# Patient Record
Sex: Male | Born: 1995 | Race: White | Hispanic: No | Marital: Single | Smoking: Current every day smoker
Health system: Southern US, Community
[De-identification: ages and names within clinical notes are randomized; demographics above are authoritative.]

## PROBLEM LIST (undated history)

## (undated) DIAGNOSIS — F419 Anxiety disorder, unspecified: Secondary | ICD-10-CM

## (undated) DIAGNOSIS — M5090 Cervical disc disorder, unspecified, unspecified cervical region: Secondary | ICD-10-CM

## (undated) HISTORY — DX: Cervical disc disorder, unspecified, unspecified cervical region: M50.90

## (undated) HISTORY — DX: Anxiety disorder, unspecified: F41.9

---
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
## 2016-07-04 ENCOUNTER — Inpatient Hospital Stay
Admit: 2016-07-04 | Discharge: 2016-07-04 | Disposition: A | Discharge status: Home / Self Care | Payer: PRIVATE HEALTH INSURANCE | Attending: Emergency Medicine

## 2016-07-04 DIAGNOSIS — T1592XA Foreign body on external eye, part unspecified, left eye, initial encounter: Secondary | ICD-10-CM

## 2016-07-04 MED ORDER — FLUORESCEIN SODIUM 1 MG OP STRP
1 MG | Freq: Once | OPHTHALMIC | Status: DC
Start: 2016-07-04 — End: 2016-07-04

## 2016-07-04 MED ORDER — TETRACAINE HCL 0.5 % OP SOLN
0.5 % | Freq: Once | OPHTHALMIC | Status: AC
Start: 2016-07-04 — End: 2016-07-04
  Administered 2016-07-04: 19:00:00 2 [drp] via OPHTHALMIC

## 2016-07-04 MED ORDER — TETRACAINE HCL 0.5 % OP SOLN
0.5 % | Freq: Once | OPHTHALMIC | Status: DC
Start: 2016-07-04 — End: 2016-07-04

## 2016-07-04 MED ORDER — FLUORESCEIN SODIUM 0.6 MG OP STRP
0.6 MG | Freq: Once | OPHTHALMIC | Status: AC
Start: 2016-07-04 — End: 2016-07-04
  Administered 2016-07-04: 19:00:00 1 via OPHTHALMIC

## 2016-07-04 MED FILL — TETRACAINE HCL 0.5 % OP SOLN: 0.5 % | OPHTHALMIC | Qty: 15

## 2016-07-04 MED FILL — FUL-GLO 1 MG OP STRP: 1 MG | OPHTHALMIC | Qty: 1

## 2016-07-04 NOTE — ED Notes (Addendum)
Bilateral 20/40  R 20/40  L 20/40  Usually wears glasses does not have them     Kelby Aline, RN  07/04/16 1357

## 2016-07-04 NOTE — ED Provider Notes (Signed)
This is a 21 y.o. male patient with No primary care provider on file., currently admitted under the care of Joylene Draft, DO, who presented to the [facility] ED with a chief complaint of eye pain.     Patient states that last night a friend and himself were playing a video game. When he felt something "fly" into his eye. Ever since then he has been struggling with eye pain and severe tearing of his eye. He continues to have a some light sensitivities. He denies any changes in vision. He does not wear contacts.           Foreign Body   Location:  L eye  Suspected object:  Unable to specify  Pain quality:  Aching, dull, pressure and tingling  Pain severity:  Moderate  Duration:  12 hours  Timing:  Constant  Progression:  Unchanged  Chronicity:  Recurrent  Worsened by:  Nothing  Ineffective treatments:  None tried  Associated symptoms: no abdominal pain, no cough, no cyanosis, no drooling, no ear pain, no nausea, no rhinorrhea, no sore throat and no vomiting    Risk factors: no developmental delay, no prior similar events and no prior surgery to area        Review of Systems   Constitutional: Negative for chills and fever.   HENT: Negative for drooling, ear pain, rhinorrhea, sinus pressure and sore throat.    Eyes: Positive for photophobia, pain and redness. Negative for discharge and visual disturbance.   Respiratory: Negative for cough, chest tightness, shortness of breath and wheezing.    Cardiovascular: Negative for chest pain and cyanosis.   Gastrointestinal: Negative for abdominal pain, diarrhea, nausea and vomiting.   Genitourinary: Negative for dysuria and frequency.   Musculoskeletal: Negative for arthralgias and back pain.   Skin: Negative for rash and wound.   Neurological: Negative for weakness and headaches.   Hematological: Negative for adenopathy.   All other systems reviewed and are negative.      Physical Exam   Constitutional: He is oriented to person, place, and time. He appears well-developed and  well-nourished.   HENT:   Head: Normocephalic and atraumatic.   Eyes: Foreign body present in the left eye. Left conjunctiva is injected. Left conjunctiva has a hemorrhage.   Slit lamp exam:       The left eye shows no corneal abrasion, no corneal flare, no corneal ulcer and no fluorescein uptake.   Neck: Normal range of motion. Neck supple.   Cardiovascular: Normal rate, regular rhythm and normal heart sounds.    No murmur heard.  Pulmonary/Chest: Effort normal and breath sounds normal. No respiratory distress. He has no wheezes. He has no rales.   Abdominal: Soft. Bowel sounds are normal. There is no tenderness. There is no rebound and no guarding.   Musculoskeletal: He exhibits no edema, tenderness or deformity.   Neurological: He is alert and oriented to person, place, and time. No cranial nerve deficit. Coordination normal.   Skin: Skin is warm and dry.   Nursing note and vitals reviewed.      Procedures    MDM  Number of Diagnoses or Management Options  Foreign body of left external eye, initial encounter:      Amount and/or Complexity of Data Reviewed  Discuss the patient with other providers: yes    Risk of Complications, Morbidity, and/or Mortality  Presenting problems: low  Diagnostic procedures: minimal  Management options: minimal    Patient Progress  Patient progress: improved  ED Course        Labs      Radiology      EKG Interpretation.    --------------------------------------------- PAST HISTORY ---------------------------------------------  Past Medical History:  has no past medical history on file.    Past Surgical History:  has no past surgical history on file.    Social History:  reports that he has never smoked. He has never used smokeless tobacco.    Family History: family history is not on file.     The patient's home medications have been reviewed.    Allergies: Amoxicillin    -------------------------------------------------- RESULTS  -------------------------------------------------  Labs:  No results found for this visit on 07/04/16.    Radiology:  No orders to display       ------------------------- NURSING NOTES AND VITALS REVIEWED ---------------------------  Date / Time Roomed:  07/04/2016  1:51 PM  ED Bed Assignment:  33/33    The nursing notes within the ED encounter and vital signs as below have been reviewed.   BP 117/72   Pulse 67   Resp 16   Ht 6' (1.829 m)   Wt 165 lb (74.8 kg)   SpO2 99%   BMI 22.38 kg/m   Oxygen Saturation Interpretation: Normal      ------------------------------------------ PROGRESS NOTES ------------------------------------------  3:00 PM  I have spoken with the patient and discussed today's results, in addition to providing specific details for the plan of care and counseling regarding the diagnosis and prognosis.  Their questions are answered at this time and they are agreeable with the plan. I discussed at length with them reasons for immediate return here for re evaluation. They will followup with their Opthalmologist and primary care physician by calling their office tomorrow.      --------------------------------- ADDITIONAL PROVIDER NOTES ---------------------------------  At this time the patient is without objective evidence of an acute process requiring hospitalization or inpatient management.  They have remained hemodynamically stable throughout their entire ED visit and are stable for discharge with outpatient follow-up.     The plan has been discussed in detail and they are aware of the specific conditions for emergent return, as well as the importance of follow-up.      There are no discharge medications for this patient.    Patient Name: Frederick Jarvis   Medical Record Number: [id number]  Date: 07/04/2016   Time: 3:01 PM   Room/Bed: 33/33  Eye Foreign Body Procedure Note  Indication: foreign body present in the eye    Procedure: The patient's head was positioned appropriately to provide adequate  exposure of the left eye using direct visualization and [employer].  Anesthesia was obtained using tetracaine drops.  Fluorescein staining was performed in the left eye and revealed no eye abrasions or increased uptake.  A less than 1 mm foreign body with the appearance of plastic was removed using a cotton swab.    The patient tolerated the procedure well.    Complications: None    Electronically Signed by:   Ralph Leyden  3:02 PM  07/04/16      Diagnosis:  1. Foreign body of left external eye, initial encounter        Disposition:  Patient's disposition: Discharge to home  Patient's condition is stable.           Ralph Leyden, MD  Resident  07/04/16 [phone]

## 2016-07-04 NOTE — ED Notes (Signed)
Frederick Jarvis is a 21 y.o. male who presented to the ED on 07/04/16 complaining of   Chief Complaint   Patient presents with   . Eye Injury     left eye, felt something get into it last night             Kelby Aline, RN  07/04/16 1358

## 2018-02-07 DIAGNOSIS — H5213 Myopia, bilateral: Secondary | ICD-10-CM | POA: Diagnosis not present

## 2018-02-07 DIAGNOSIS — H52223 Regular astigmatism, bilateral: Secondary | ICD-10-CM | POA: Diagnosis not present

## 2018-02-07 DIAGNOSIS — H04123 Dry eye syndrome of bilateral lacrimal glands: Secondary | ICD-10-CM | POA: Diagnosis not present

## 2018-02-07 DIAGNOSIS — H1045 Other chronic allergic conjunctivitis: Secondary | ICD-10-CM | POA: Diagnosis not present

## 2018-04-28 DIAGNOSIS — R1084 Generalized abdominal pain: Secondary | ICD-10-CM | POA: Diagnosis not present

## 2018-04-28 DIAGNOSIS — E559 Vitamin D deficiency, unspecified: Secondary | ICD-10-CM | POA: Diagnosis not present

## 2018-04-28 DIAGNOSIS — R5383 Other fatigue: Secondary | ICD-10-CM | POA: Diagnosis not present

## 2018-04-28 DIAGNOSIS — Z0000 Encounter for general adult medical examination without abnormal findings: Secondary | ICD-10-CM | POA: Diagnosis not present

## 2018-04-28 DIAGNOSIS — M545 Low back pain: Secondary | ICD-10-CM | POA: Diagnosis not present

## 2018-04-28 DIAGNOSIS — Z13220 Encounter for screening for lipoid disorders: Secondary | ICD-10-CM | POA: Diagnosis not present

## 2018-04-29 ENCOUNTER — Other Ambulatory Visit (HOSPITAL_COMMUNITY): Payer: Self-pay | Admitting: Student

## 2018-04-29 ENCOUNTER — Ambulatory Visit (HOSPITAL_COMMUNITY)
Admission: RE | Admit: 2018-04-29 | Discharge: 2018-04-29 | Disposition: A | Discharge status: Home / Self Care | Payer: Self-pay | Source: Ambulatory Visit | Attending: Internal Medicine | Admitting: Internal Medicine

## 2018-04-29 ENCOUNTER — Other Ambulatory Visit (HOSPITAL_COMMUNITY): Payer: Self-pay | Admitting: Internal Medicine

## 2018-04-29 DIAGNOSIS — M545 Low back pain, unspecified: Secondary | ICD-10-CM

## 2018-05-09 ENCOUNTER — Other Ambulatory Visit (HOSPITAL_COMMUNITY): Payer: Self-pay | Admitting: Internal Medicine

## 2018-05-09 ENCOUNTER — Other Ambulatory Visit: Payer: Self-pay | Admitting: Internal Medicine

## 2018-05-09 DIAGNOSIS — M545 Low back pain, unspecified: Secondary | ICD-10-CM

## 2018-05-12 ENCOUNTER — Ambulatory Visit (HOSPITAL_COMMUNITY): Payer: Self-pay

## 2018-05-14 ENCOUNTER — Ambulatory Visit (HOSPITAL_COMMUNITY)
Admission: RE | Admit: 2018-05-14 | Discharge: 2018-05-14 | Disposition: A | Discharge status: Home / Self Care | Payer: 59 | Source: Ambulatory Visit | Attending: Internal Medicine | Admitting: Internal Medicine

## 2018-05-14 DIAGNOSIS — M545 Low back pain, unspecified: Secondary | ICD-10-CM

## 2018-05-23 DIAGNOSIS — M50122 Cervical disc disorder at C5-C6 level with radiculopathy: Secondary | ICD-10-CM | POA: Diagnosis not present

## 2018-05-23 DIAGNOSIS — M9903 Segmental and somatic dysfunction of lumbar region: Secondary | ICD-10-CM | POA: Diagnosis not present

## 2018-05-23 DIAGNOSIS — M546 Pain in thoracic spine: Secondary | ICD-10-CM | POA: Diagnosis not present

## 2018-05-23 DIAGNOSIS — M9901 Segmental and somatic dysfunction of cervical region: Secondary | ICD-10-CM | POA: Diagnosis not present

## 2018-05-23 DIAGNOSIS — M9902 Segmental and somatic dysfunction of thoracic region: Secondary | ICD-10-CM | POA: Diagnosis not present

## 2018-05-23 DIAGNOSIS — M542 Cervicalgia: Secondary | ICD-10-CM | POA: Diagnosis not present

## 2018-05-23 DIAGNOSIS — M545 Low back pain: Secondary | ICD-10-CM | POA: Diagnosis not present

## 2018-05-25 DIAGNOSIS — M546 Pain in thoracic spine: Secondary | ICD-10-CM | POA: Diagnosis not present

## 2018-05-25 DIAGNOSIS — M545 Low back pain: Secondary | ICD-10-CM | POA: Diagnosis not present

## 2018-05-25 DIAGNOSIS — M9902 Segmental and somatic dysfunction of thoracic region: Secondary | ICD-10-CM | POA: Diagnosis not present

## 2018-05-25 DIAGNOSIS — M50122 Cervical disc disorder at C5-C6 level with radiculopathy: Secondary | ICD-10-CM | POA: Diagnosis not present

## 2018-05-25 DIAGNOSIS — M9903 Segmental and somatic dysfunction of lumbar region: Secondary | ICD-10-CM | POA: Diagnosis not present

## 2018-05-25 DIAGNOSIS — M542 Cervicalgia: Secondary | ICD-10-CM | POA: Diagnosis not present

## 2018-05-25 DIAGNOSIS — M9901 Segmental and somatic dysfunction of cervical region: Secondary | ICD-10-CM | POA: Diagnosis not present

## 2018-05-26 DIAGNOSIS — M9901 Segmental and somatic dysfunction of cervical region: Secondary | ICD-10-CM | POA: Diagnosis not present

## 2018-05-26 DIAGNOSIS — M542 Cervicalgia: Secondary | ICD-10-CM | POA: Diagnosis not present

## 2018-05-26 DIAGNOSIS — M9903 Segmental and somatic dysfunction of lumbar region: Secondary | ICD-10-CM | POA: Diagnosis not present

## 2018-05-26 DIAGNOSIS — M50122 Cervical disc disorder at C5-C6 level with radiculopathy: Secondary | ICD-10-CM | POA: Diagnosis not present

## 2018-05-26 DIAGNOSIS — M9902 Segmental and somatic dysfunction of thoracic region: Secondary | ICD-10-CM | POA: Diagnosis not present

## 2018-05-26 DIAGNOSIS — M545 Low back pain: Secondary | ICD-10-CM | POA: Diagnosis not present

## 2018-05-26 DIAGNOSIS — M546 Pain in thoracic spine: Secondary | ICD-10-CM | POA: Diagnosis not present

## 2018-06-01 DIAGNOSIS — M9902 Segmental and somatic dysfunction of thoracic region: Secondary | ICD-10-CM | POA: Diagnosis not present

## 2018-06-01 DIAGNOSIS — M545 Low back pain: Secondary | ICD-10-CM | POA: Diagnosis not present

## 2018-06-01 DIAGNOSIS — M546 Pain in thoracic spine: Secondary | ICD-10-CM | POA: Diagnosis not present

## 2018-06-01 DIAGNOSIS — M542 Cervicalgia: Secondary | ICD-10-CM | POA: Diagnosis not present

## 2018-06-01 DIAGNOSIS — M50122 Cervical disc disorder at C5-C6 level with radiculopathy: Secondary | ICD-10-CM | POA: Diagnosis not present

## 2018-06-01 DIAGNOSIS — M9901 Segmental and somatic dysfunction of cervical region: Secondary | ICD-10-CM | POA: Diagnosis not present

## 2018-06-01 DIAGNOSIS — M9903 Segmental and somatic dysfunction of lumbar region: Secondary | ICD-10-CM | POA: Diagnosis not present

## 2018-06-02 DIAGNOSIS — M9901 Segmental and somatic dysfunction of cervical region: Secondary | ICD-10-CM | POA: Diagnosis not present

## 2018-06-02 DIAGNOSIS — M9903 Segmental and somatic dysfunction of lumbar region: Secondary | ICD-10-CM | POA: Diagnosis not present

## 2018-06-02 DIAGNOSIS — M545 Low back pain: Secondary | ICD-10-CM | POA: Diagnosis not present

## 2018-06-02 DIAGNOSIS — M542 Cervicalgia: Secondary | ICD-10-CM | POA: Diagnosis not present

## 2018-06-02 DIAGNOSIS — M50122 Cervical disc disorder at C5-C6 level with radiculopathy: Secondary | ICD-10-CM | POA: Diagnosis not present

## 2018-06-02 DIAGNOSIS — M9902 Segmental and somatic dysfunction of thoracic region: Secondary | ICD-10-CM | POA: Diagnosis not present

## 2018-06-02 DIAGNOSIS — M546 Pain in thoracic spine: Secondary | ICD-10-CM | POA: Diagnosis not present

## 2018-07-05 DIAGNOSIS — M9902 Segmental and somatic dysfunction of thoracic region: Secondary | ICD-10-CM | POA: Diagnosis not present

## 2018-07-05 DIAGNOSIS — M545 Low back pain: Secondary | ICD-10-CM | POA: Diagnosis not present

## 2018-07-05 DIAGNOSIS — M50122 Cervical disc disorder at C5-C6 level with radiculopathy: Secondary | ICD-10-CM | POA: Diagnosis not present

## 2018-07-05 DIAGNOSIS — M542 Cervicalgia: Secondary | ICD-10-CM | POA: Diagnosis not present

## 2018-07-05 DIAGNOSIS — M9901 Segmental and somatic dysfunction of cervical region: Secondary | ICD-10-CM | POA: Diagnosis not present

## 2018-07-05 DIAGNOSIS — M9903 Segmental and somatic dysfunction of lumbar region: Secondary | ICD-10-CM | POA: Diagnosis not present

## 2018-07-05 DIAGNOSIS — M546 Pain in thoracic spine: Secondary | ICD-10-CM | POA: Diagnosis not present

## 2018-07-07 DIAGNOSIS — M9903 Segmental and somatic dysfunction of lumbar region: Secondary | ICD-10-CM | POA: Diagnosis not present

## 2018-07-07 DIAGNOSIS — M546 Pain in thoracic spine: Secondary | ICD-10-CM | POA: Diagnosis not present

## 2018-07-07 DIAGNOSIS — M9901 Segmental and somatic dysfunction of cervical region: Secondary | ICD-10-CM | POA: Diagnosis not present

## 2018-07-07 DIAGNOSIS — M542 Cervicalgia: Secondary | ICD-10-CM | POA: Diagnosis not present

## 2018-07-07 DIAGNOSIS — M9902 Segmental and somatic dysfunction of thoracic region: Secondary | ICD-10-CM | POA: Diagnosis not present

## 2018-07-07 DIAGNOSIS — M545 Low back pain: Secondary | ICD-10-CM | POA: Diagnosis not present

## 2018-07-07 DIAGNOSIS — M50122 Cervical disc disorder at C5-C6 level with radiculopathy: Secondary | ICD-10-CM | POA: Diagnosis not present

## 2020-05-01 ENCOUNTER — Encounter: Payer: Self-pay | Admitting: Physician Assistant

## 2020-05-03 ENCOUNTER — Encounter: Payer: Self-pay | Admitting: Physician Assistant

## 2020-05-12 IMAGING — MR MR LUMBAR SPINE W/O CM
4 of 5 series · 19 of 48 positions shown · non-contrast
Comparison: Prior radiographs from 04/29/2018.

CLINICAL DATA: Initial evaluation for chronic low back pain for 3
years.

EXAM:
MRI LUMBAR SPINE WITHOUT CONTRAST
TECHNIQUE: Multiplanar, multisequence MR imaging of the lumbar spine was
performed. No intravenous contrast was administered.

[Series 3: T1 · sagittal · 4.0mm · 0.51mm/px · 3 of 12 slices shown (1 of 2)]
[im 3/12]
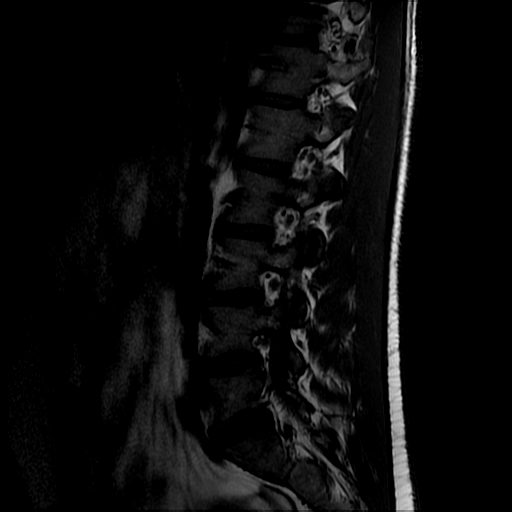
[im 7/12]
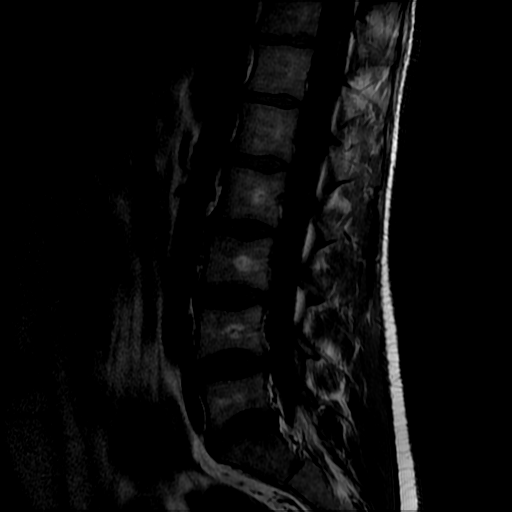
[im 12/12]
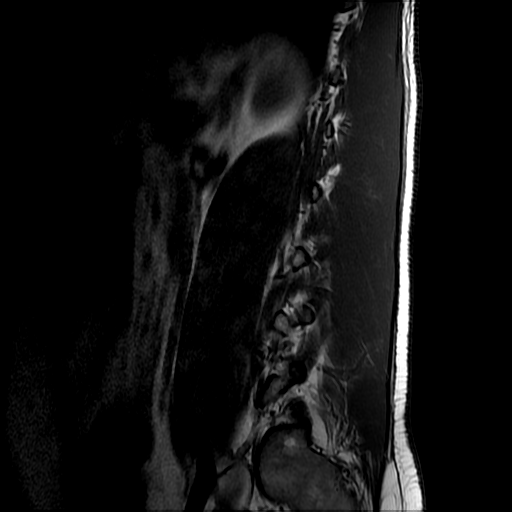

[Series 4: T2 post-contrast · sagittal · 4.0mm · 0.51mm/px · 5 of 12 slices shown]
[im 1/12]
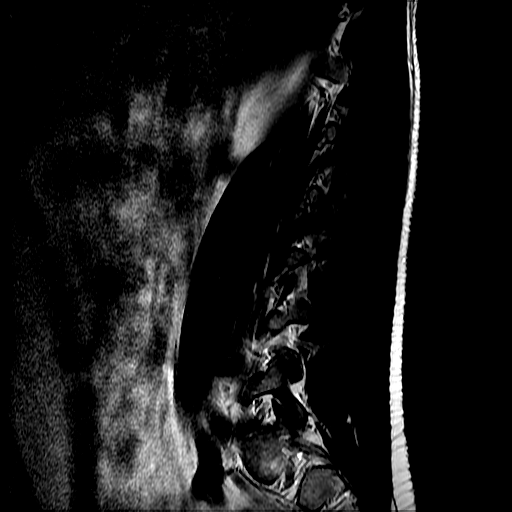
[im 3/12]
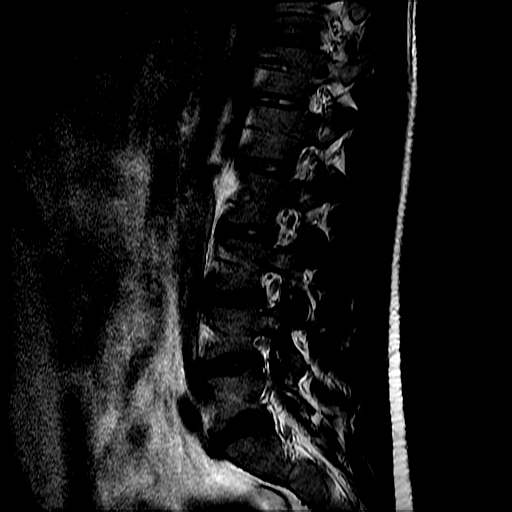
[im 6/12]
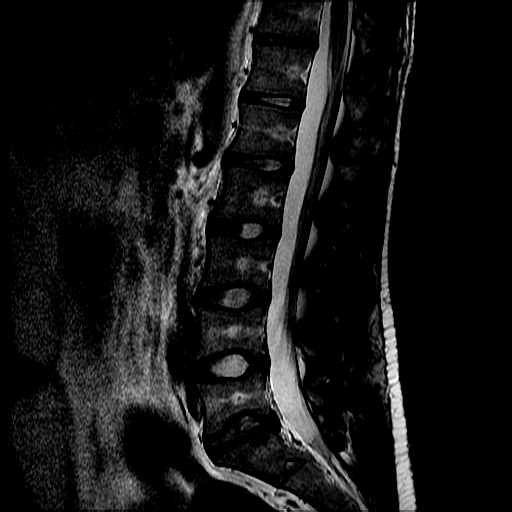
[im 9/12]
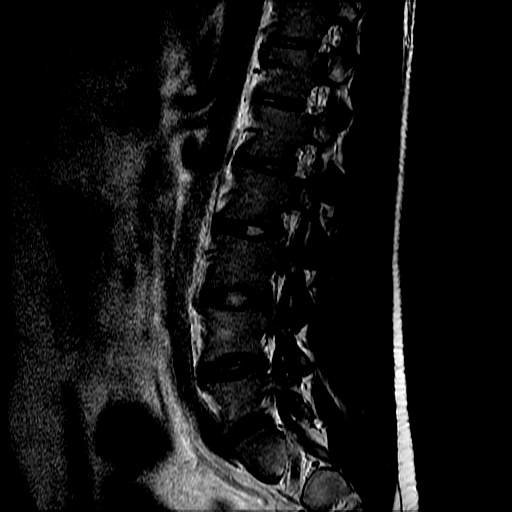
[im 12/12]
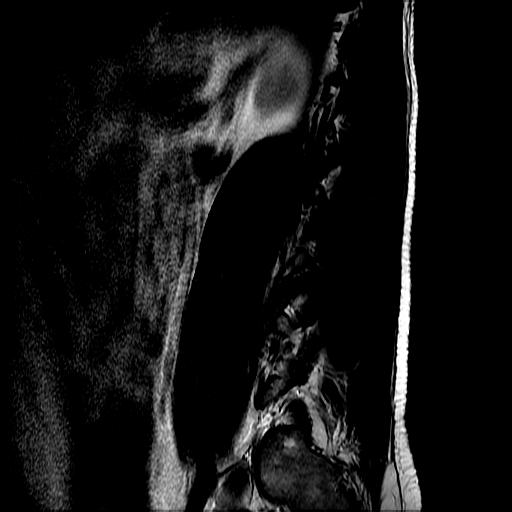

[Series 6: T2 · axial · 4.0mm · 0.39mm/px · z∈[-146,+27]mm · 8 of 36 slices shown]
[im 3/36]
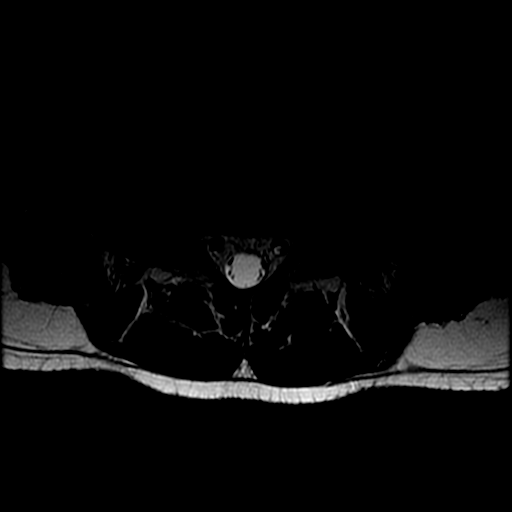
[im 5/36]
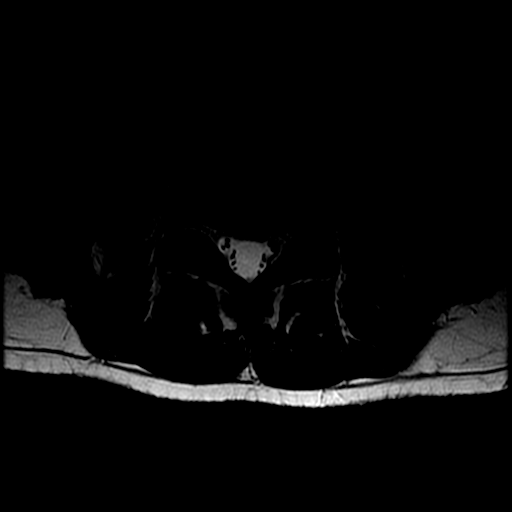
[im 8/36]
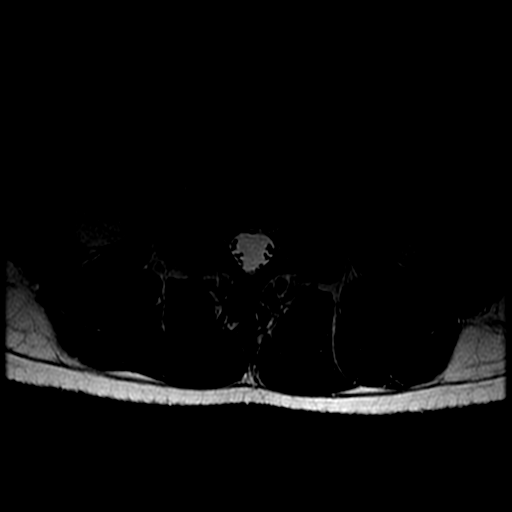
[im 12/36]
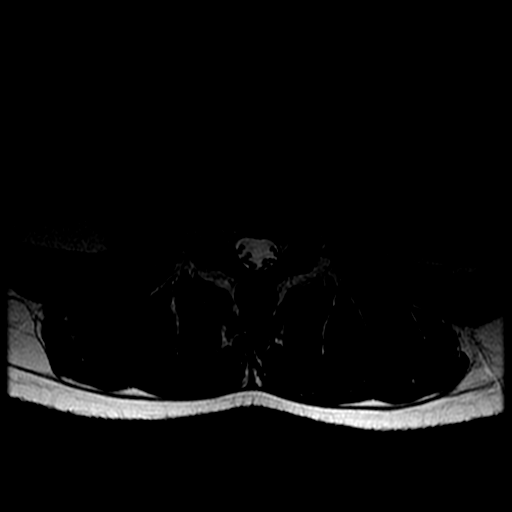
[im 17/36]
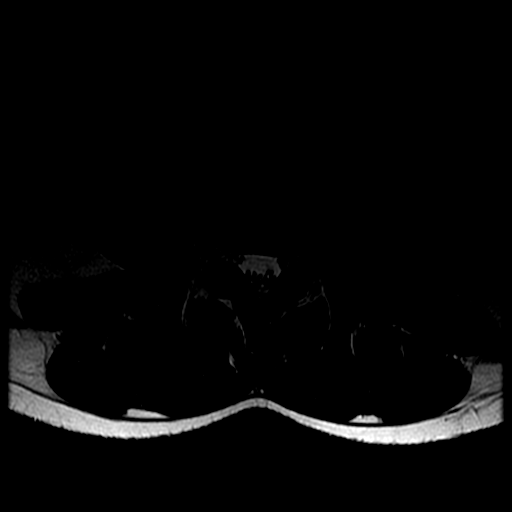
[im 19/36]
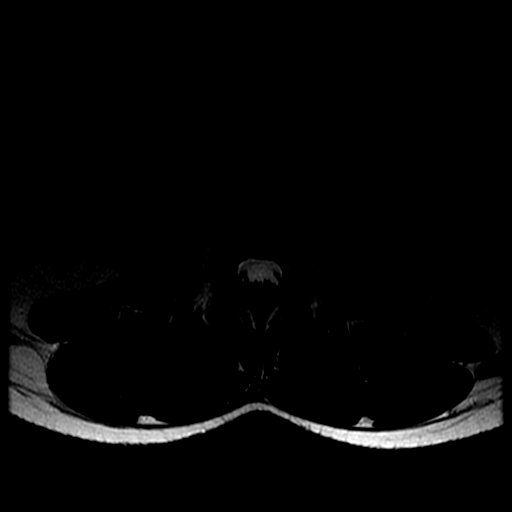
[im 22/36]
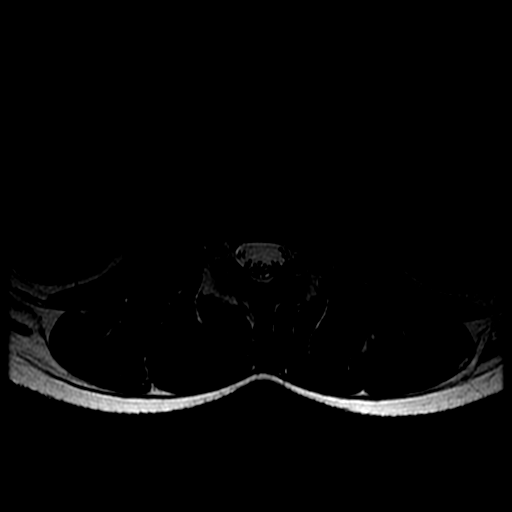
[im 31/36]
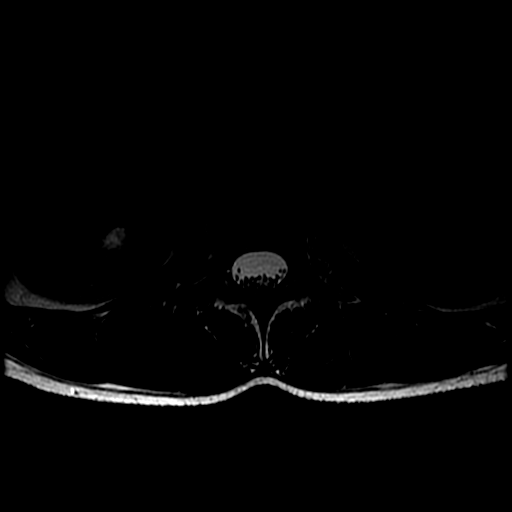

[Series 7: T1 · axial · 4.0mm · 0.39mm/px · z∈[-136,+27]mm · 3 of 36 slices shown (2 of 2)]
[im 5/36]
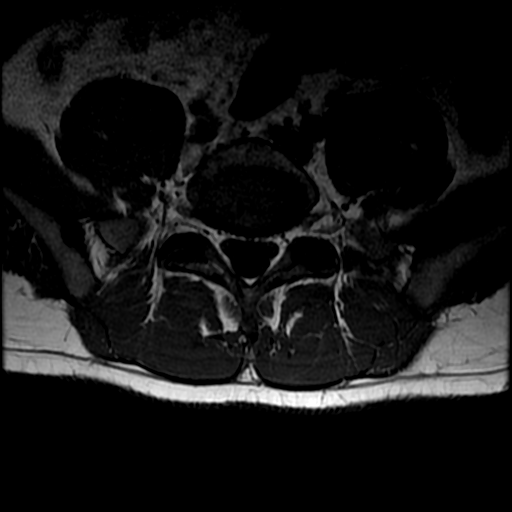
[im 19/36]
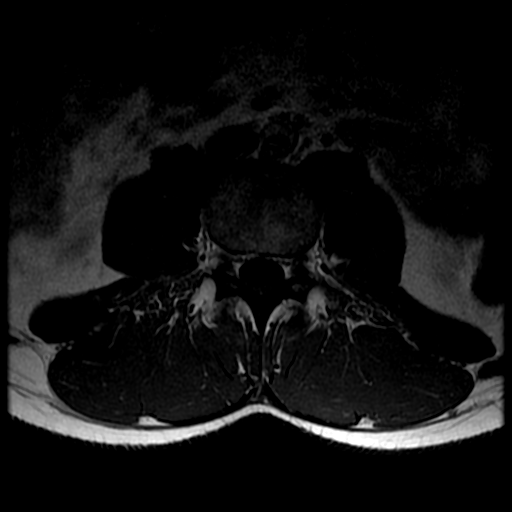
[im 31/36]
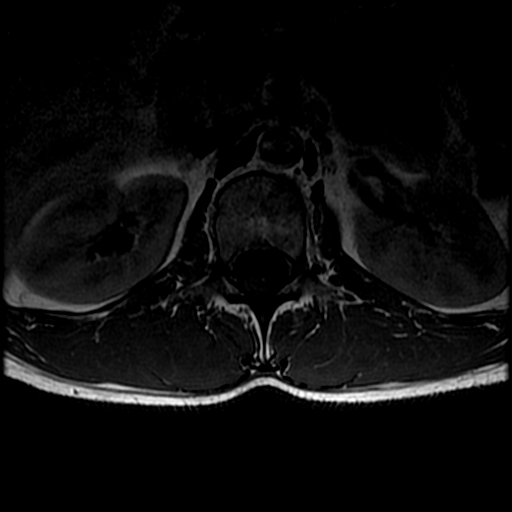

[19 of 48 positions shown; findings below may reference images not displayed]

FINDINGS: Segmentation: Standard. Lowest well-formed disc labeled the L5-S1
level.

Alignment: Mild levoscoliosis. Alignment otherwise normal with
preservation of the normal lumbar lordosis. No listhesis or
subluxation.

Vertebrae: Vertebral body heights maintained without evidence for
acute or chronic fracture. Bone marrow signal intensity within
normal limits. No discrete or worrisome osseous lesions. No abnormal
marrow edema to suggest acute stress reaction and/or stress
response. No stress fracture or pars defect.

Conus medullaris and cauda equina: Conus extends to the T12 level.
Conus and cauda equina appear normal.

Paraspinal and other soft tissues: Paraspinous soft tissues within
normal limits. Visualized visceral structures are unremarkable.

Disc levels:

No significant disc pathology within the lumbar spine.
Intervertebral discs are fairly well hydrated with preserved disc
height. No disc bulge or focal disc protrusion. Mild facet
hypertrophy present at L3-4 bilaterally. No other significant facet
degeneration. No canal or neural foraminal stenosis. No neural
impingement.
IMPRESSION: 1. Mild bilateral facet hypertrophy at L3-4 without stenosis.
2. Underlying mild levoconvex scoliosis.
3. Otherwise unremarkable MRI of the lumbar spine.

## 2020-05-17 ENCOUNTER — Ambulatory Visit: Payer: 59 | Admitting: Physician Assistant

## 2020-05-17 ENCOUNTER — Other Ambulatory Visit (INDEPENDENT_AMBULATORY_CARE_PROVIDER_SITE_OTHER): Payer: 59

## 2020-05-17 ENCOUNTER — Encounter: Payer: Self-pay | Admitting: Physician Assistant

## 2020-05-17 ENCOUNTER — Other Ambulatory Visit: Payer: Self-pay | Admitting: Physician Assistant

## 2020-05-17 VITALS — BP 100/60 | HR 77 | Ht 73.0 in | Wt 169.0 lb

## 2020-05-17 DIAGNOSIS — K219 Gastro-esophageal reflux disease without esophagitis: Secondary | ICD-10-CM | POA: Diagnosis not present

## 2020-05-17 DIAGNOSIS — R1031 Right lower quadrant pain: Secondary | ICD-10-CM

## 2020-05-17 DIAGNOSIS — R10813 Right lower quadrant abdominal tenderness: Secondary | ICD-10-CM | POA: Diagnosis not present

## 2020-05-17 LAB — COMPREHENSIVE METABOLIC PANEL
ALT: 22 U/L (ref 0–53)
AST: 11 U/L (ref 0–37)
Albumin: 4.9 g/dL (ref 3.5–5.2)
Alkaline Phosphatase: 50 U/L (ref 39–117)
BUN: 12 mg/dL (ref 6–23)
CO2: 29 mEq/L (ref 19–32)
Calcium: 9.9 mg/dL (ref 8.4–10.5)
Chloride: 104 mEq/L (ref 96–112)
Creatinine, Ser: 0.92 mg/dL (ref 0.40–1.50)
GFR: 117.17 mL/min (ref >60.00)
Glucose, Bld: 93 mg/dL (ref 70–99)
Potassium: 4.2 mEq/L (ref 3.5–5.1)
Sodium: 140 mEq/L (ref 135–145)
Total Bilirubin: 0.5 mg/dL (ref 0.2–1.2)
Total Protein: 7.4 g/dL (ref 6.0–8.3)

## 2020-05-17 LAB — CBC WITH DIFFERENTIAL/PLATELET
Basophils Absolute: 0 10*3/uL (ref 0.0–0.1)
Basophils Relative: 0.9 % (ref 0.0–3.0)
Eosinophils Absolute: 0.4 10*3/uL (ref 0.0–0.7)
Eosinophils Relative: 7.2 % — ABNORMAL HIGH (ref 0.0–5.0)
HCT: 43.6 % (ref 39.0–52.0)
Hemoglobin: 14.7 g/dL (ref 13.0–17.0)
Lymphocytes Relative: 43.3 % (ref 12.0–46.0)
Lymphs Abs: 2.2 10*3/uL (ref 0.7–4.0)
MCHC: 33.8 g/dL (ref 30.0–36.0)
MCV: 85.1 fl (ref 78.0–100.0)
Monocytes Absolute: 0.4 10*3/uL (ref 0.1–1.0)
Monocytes Relative: 7.5 % (ref 3.0–12.0)
Neutro Abs: 2.1 10*3/uL (ref 1.4–7.7)
Neutrophils Relative %: 41.1 % — ABNORMAL LOW (ref 43.0–77.0)
Platelets: 205 10*3/uL (ref 150.0–400.0)
RBC: 5.13 Mil/uL (ref 4.22–5.81)
RDW: 13.5 % (ref 11.5–15.5)
WBC: 5.1 10*3/uL (ref 4.0–10.5)

## 2020-05-17 LAB — C-REACTIVE PROTEIN: CRP: <1 mg/dL (ref 0.5–20.0)

## 2020-05-17 LAB — H. PYLORI ANTIBODY, IGG: H Pylori IgG: NEGATIVE

## 2020-05-17 MED ORDER — ESOMEPRAZOLE MAGNESIUM 40 MG PO CPDR: 40.0000 mg | DELAYED_RELEASE_CAPSULE | Freq: Every day | ORAL | 11 refills | Status: DC

## 2020-05-17 MED FILL — ESOMEPRAZOLE MAG DR 40 MG C: 40 | 30 days supply | Qty: 30 | Fill #0

## 2020-05-17 NOTE — Progress Notes (Signed)
Subjective:    Patient ID: Jackson Russell, male    DOB: [birth year], 25 y.o.   MRN: [id number]  HPI [name] is a pleasant 25 year old white male, new to GI today, self-referred for evaluation of ongoing GI issues.  He has not had any prior GI evaluation. He says he has had some GI symptoms over the past 5 to 6 years which had been mild but over the past 6 months he has had significant symptoms.  He reports that he lives in [place] but has come to [place] because of his GI symptoms as his mom lives here. He reports very frequent issues with early morning nausea and frequent vomiting early in the morning when he initially gets up.  He says his stomach had been hurting on a regular basis.  He has been taking twice daily Nexium OTC over the past 1 month since coming to [place] and says he has noted maybe some mild improvement but continues to have abdominal pain.  When asked about the location of his abdominal pain he points to the right lower abdomen.  He says there is some constant sense of discomfort there and then some intermittent sharp and burning pain.  He says he is usually having bowel movements after eating now and that does ease the discomfort.  Bowel movements have been formed, perhaps more frequent.  He says he has noticed small amounts of blood on the tissue in the past but only if constipated.  His appetite has been okay, weight has been stable.  He feels that the Nexium has helped reflux symptoms and has also improved the early morning nausea.  No complaints of dysphagia or odynophagia. No regular aspirin or NSAIDs. No regular EtOH Does use marijuana intermittently. Family history unknown, patient adopted No recent labs, no prior abdominal imaging  Review of Systems Pertinent positive and negative review of systems were noted in the above HPI section.  All other review of systems was otherwise negative.  Outpatient Encounter Medications as of 05/17/2020  Medication Sig  .  esomeprazole (NEXIUM) 40 MG capsule Take 1 capsule (40 mg total) by mouth daily before breakfast.  . [DISCONTINUED] esomeprazole (NEXIUM) 20 MG capsule Take 20 mg by mouth daily at 12 noon. OTC   No facility-administered encounter medications on file as of 05/17/2020.   Allergies  Allergen Reactions  . Amoxicillin     "swell up, throat gets swollen"   There are no problems to display for this patient.  Social History   Socioeconomic History  . Marital status: Single    Spouse name: Not on file  . Number of children: Not on file  . Years of education: Not on file  . Highest education level: Not on file  Occupational History  . Not on file  Tobacco Use  . Smoking status: Current Every Day Smoker    Types: Cigarettes  . Smokeless tobacco: Not on file  Substance and Sexual Activity  . Alcohol use: Yes    Comment: occasional  . Drug use: Yes    Types: Marijuana  . Sexual activity: Not on file  Other Topics Concern  . Not on file  Social History Narrative  . Not on file   Social Determinants of Health   Financial Resource Strain: Not on file  Food Insecurity: Not on file  Transportation Needs: Not on file  Physical Activity: Not on file  Stress: Not on file  Social Connections: Not on file  Intimate Partner Violence:  Not on file    Mr. Rueter family history is not on file. He was adopted.      Objective:    Vitals:   05/17/20 0834  BP: 100/60  Pulse: 77    Physical Exam Well-developed well-nourished young white male in no acute distress.  Height, Weight,169  BMI 22  HEENT; nontraumatic normocephalic, EOMI, PE R LA, sclera anicteric. Oropharynx; not examined today Neck; supple, no JVD Cardiovascular; regular rate and rhythm with S1-S2, no murmur rub or gallop Pulmonary; Clear bilaterally Abdomen; soft, is tender in the right lower quadrant, no palpable mass, no rebound, nondistended, no palpable mass or hepatosplenomegaly, bowel sounds are active Rectal;  not done today Skin; benign exam, no jaundice rash or appreciable lesions Extremities; no clubbing cyanosis or edema skin warm and dry Neuro/Psych; alert and oriented x4, grossly nonfocal mood and affect appropriate       Assessment & Plan:   #75 24 year old white male with several month history of frequent early morning nausea and intermittent vomiting, and intermittent daytime reflux symptoms. Some improvement in the symptoms on twice daily OTC Nexium and suspect probably secondary to GERD.  #2  13-monthhistory of abdominal pain, primarily right lower quadrant.  New postprandial bowel movements with some decrease in abdominal discomfort post defecation. Etiology not clear, rule out IBD, rule out other intra-abdominal inflammatory process  Plan; start Nexium 40 mg 1 p.o. every morning AC breakfast Check CBC with differential, c-Met, CRP, TTG and IgA, H. pylori antibody  Patient will be scheduled for CT scan of the abdomen and pelvis with contrast. Further recommendations pending results of above.  Patient will be established with Dr. MRush Landmark Briefly discussed potential EGD and/or colonoscopy, depending on results of CT.     Alexus Galka SGenia HaroldPA-C 05/17/2020   Cc: GDoree Albee MD

## 2020-05-17 NOTE — Patient Instructions (Signed)
Your provider has requested that you go to the basement level for lab work before leaving today. Press "B" on the elevator. The lab is located at the first door on the left as you exit the elevator.  Due to recent changes in healthcare laws, you may see the results of your imaging and laboratory studies on MyChart before your provider has had a chance to review them.  We understand that in some cases there may be results that are confusing or concerning to you. Not all laboratory results come back in the same time frame and the provider may be waiting for multiple results in order to interpret others.  Please give [place] 48 hours in order for your provider to thoroughly review all the results before contacting the office for clarification of your results.   We have sent the following medications to your pharmacy for you to pick up at your convenience: Nexium  You have been scheduled for a CT scan of the abdomen and pelvis at [facility] ([address]---this is in the same building as [employer]).   You are scheduled on 05/20/2020 at 9:00am. You should arrive 15 minutes prior to your appointment time for registration. Please follow the written instructions below on the day of your exam:  WARNING: IF YOU ARE ALLERGIC TO IODINE/X-RAY DYE, PLEASE NOTIFY RADIOLOGY IMMEDIATELY AT [phone]! YOU WILL BE GIVEN A 13 HOUR PREMEDICATION PREP.  1) Do not eat or drink anything after 5:00am (4 hours prior to your test) 2) You have been given 2 bottles of oral contrast to drink. The solution may taste better if refrigerated, but do NOT add ice or any other liquid to this solution. Shake well before drinking.    Drink 1 bottle of contrast @ 7:00am (2 hours prior to your exam)  Drink 1 bottle of contrast @ 8:00am (1 hour prior to your exam)  You may take any medications as prescribed with a small amount of water, if necessary. If you take any of the following medications: METFORMIN,  GLUCOPHAGE, GLUCOVANCE, AVANDAMET, RIOMET, FORTAMET, [place] MET, JANUMET, GLUMETZA or METAGLIP, you MAY be asked to HOLD this medication 48 hours AFTER the exam.  The purpose of you drinking the oral contrast is to aid in the visualization of your intestinal tract. The contrast solution may cause some diarrhea. Depending on your individual set of symptoms, you may also receive an intravenous injection of x-ray contrast/dye. Plan on being at [facility] for 30 minutes or longer, depending on the type of exam you are having performed.  This test typically takes 30-45 minutes to complete.  If you have any questions regarding your exam or if you need to reschedule, you may call the CT department at [phone] between the hours of 8:00 am and 5:00 pm, Monday-Friday.  ________________________________________________________________________  I appreciate the opportunity to care for you. Amy Esterwood, PA-C

## 2020-05-18 NOTE — Progress Notes (Signed)
Attending Physician's Attestation   I have reviewed the chart.   I agree with the Advanced Practitioner's note, impression, and recommendations with any updates as below.    Gabriel Mansouraty, MD [facility] Office # [phone]  

## 2020-05-20 ENCOUNTER — Other Ambulatory Visit: Payer: Self-pay

## 2020-05-20 ENCOUNTER — Ambulatory Visit (INDEPENDENT_AMBULATORY_CARE_PROVIDER_SITE_OTHER)
Admission: RE | Admit: 2020-05-20 | Discharge: 2020-05-20 | Disposition: A | Discharge status: Home / Self Care | Payer: 59 | Source: Ambulatory Visit | Attending: Physician Assistant | Admitting: Physician Assistant

## 2020-05-20 DIAGNOSIS — R109 Unspecified abdominal pain: Secondary | ICD-10-CM | POA: Diagnosis not present

## 2020-05-20 DIAGNOSIS — K219 Gastro-esophageal reflux disease without esophagitis: Secondary | ICD-10-CM

## 2020-05-20 DIAGNOSIS — R1031 Right lower quadrant pain: Secondary | ICD-10-CM

## 2020-05-20 DIAGNOSIS — R10813 Right lower quadrant abdominal tenderness: Secondary | ICD-10-CM | POA: Diagnosis not present

## 2020-05-20 LAB — TISSUE TRANSGLUTAMINASE, IGA: (tTG) Ab, IgA: <1 U/mL

## 2020-05-20 LAB — IGA: Immunoglobulin A: 152 mg/dL (ref 47–310)

## 2020-05-20 MED ORDER — IOHEXOL 300 MG/ML  SOLN
100.0000 mL | Freq: Once | INTRAMUSCULAR | Status: AC | PRN
  Administered 2020-05-20: 100 mL via INTRAVENOUS

## 2020-06-06 ENCOUNTER — Other Ambulatory Visit: Payer: Self-pay | Admitting: Physician Assistant

## 2020-06-06 MED ORDER — ESOMEPRAZOLE MAGNESIUM 40 MG PO CPDR: 40.0000 mg | DELAYED_RELEASE_CAPSULE | Freq: Every day | ORAL | 3 refills | Status: DC

## 2020-06-10 MED FILL — ESOMEPRAZOLE MAG DR 40 MG C: 40 | 90 days supply | Qty: 90 | Fill #0

## 2020-09-24 ENCOUNTER — Other Ambulatory Visit (HOSPITAL_COMMUNITY): Payer: Self-pay

## 2020-09-24 MED FILL — Esomeprazole Magnesium Cap Delayed Release 40 MG (Base Eq): ORAL | 90 days supply | Qty: 90 | Fill #0 | Status: AC

## 2020-12-25 ENCOUNTER — Other Ambulatory Visit (HOSPITAL_COMMUNITY): Payer: Self-pay

## 2020-12-25 MED FILL — Esomeprazole Magnesium Cap Delayed Release 40 MG (Base Eq): ORAL | 90 days supply | Qty: 90 | Fill #1 | Status: AC

## 2021-03-26 ENCOUNTER — Other Ambulatory Visit (HOSPITAL_COMMUNITY): Payer: Self-pay

## 2021-03-26 MED FILL — Esomeprazole Magnesium Cap Delayed Release 40 MG (Base Eq): ORAL | 90 days supply | Qty: 90 | Fill #2 | Status: AC

## 2021-06-13 ENCOUNTER — Other Ambulatory Visit: Payer: Self-pay | Admitting: Physician Assistant

## 2021-06-16 ENCOUNTER — Other Ambulatory Visit (HOSPITAL_COMMUNITY): Payer: Self-pay

## 2021-06-16 MED ORDER — ESOMEPRAZOLE MAGNESIUM 40 MG PO CPDR
DELAYED_RELEASE_CAPSULE | ORAL | 0 refills | Status: AC
  Filled 2021-06-16: qty 90, 90d supply, fill #0
